# Patient Record
Sex: Female | Born: 2000 | Race: Black or African American | Hispanic: No | Marital: Single | Smoking: Current every day smoker
Health system: Southern US, Community
[De-identification: ages and names within clinical notes are randomized; demographics above are authoritative.]

## PROBLEM LIST (undated history)

## (undated) ENCOUNTER — Inpatient Hospital Stay (HOSPITAL_COMMUNITY): Payer: Self-pay

## (undated) ENCOUNTER — Emergency Department (HOSPITAL_COMMUNITY): Payer: Medicaid Other | Source: Home / Self Care

## (undated) DIAGNOSIS — J45909 Unspecified asthma, uncomplicated: Secondary | ICD-10-CM

---
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
## 2019-12-11 ENCOUNTER — Emergency Department (HOSPITAL_COMMUNITY): Payer: Medicaid Other

## 2019-12-11 ENCOUNTER — Emergency Department (HOSPITAL_COMMUNITY)
Admission: EM | Admit: 2019-12-11 | Discharge: 2019-12-12 | Disposition: A | Discharge status: Home / Self Care | Payer: Medicaid Other | Attending: Emergency Medicine | Admitting: Emergency Medicine

## 2019-12-11 ENCOUNTER — Encounter (HOSPITAL_COMMUNITY): Payer: Self-pay

## 2019-12-11 ENCOUNTER — Other Ambulatory Visit: Payer: Self-pay

## 2019-12-11 DIAGNOSIS — Z23 Encounter for immunization: Secondary | ICD-10-CM | POA: Diagnosis not present

## 2019-12-11 DIAGNOSIS — Y92810 Car as the place of occurrence of the external cause: Secondary | ICD-10-CM | POA: Diagnosis not present

## 2019-12-11 DIAGNOSIS — S79911A Unspecified injury of right hip, initial encounter: Secondary | ICD-10-CM | POA: Diagnosis present

## 2019-12-11 DIAGNOSIS — Y999 Unspecified external cause status: Secondary | ICD-10-CM | POA: Insufficient documentation

## 2019-12-11 DIAGNOSIS — S71001A Unspecified open wound, right hip, initial encounter: Secondary | ICD-10-CM | POA: Diagnosis not present

## 2019-12-11 DIAGNOSIS — M25551 Pain in right hip: Secondary | ICD-10-CM | POA: Insufficient documentation

## 2019-12-11 DIAGNOSIS — Y9389 Activity, other specified: Secondary | ICD-10-CM | POA: Insufficient documentation

## 2019-12-11 DIAGNOSIS — W3400XA Accidental discharge from unspecified firearms or gun, initial encounter: Secondary | ICD-10-CM | POA: Diagnosis not present

## 2019-12-11 LAB — CBC
HCT: 38.2 % (ref 36.0–46.0)
Hemoglobin: 13.4 g/dL (ref 12.0–15.0)
MCH: 31.2 pg (ref 26.0–34.0)
MCHC: 35.1 g/dL (ref 30.0–36.0)
MCV: 88.8 fL (ref 80.0–100.0)
Platelets: 220 10*3/uL (ref 150–400)
RBC: 4.3 MIL/uL (ref 3.87–5.11)
RDW: 13.2 % (ref 11.5–15.5)
WBC: 6.1 10*3/uL (ref 4.0–10.5)
nRBC: 0 % (ref 0.0–0.2)

## 2019-12-11 LAB — COMPREHENSIVE METABOLIC PANEL
ALT: 13 U/L (ref 0–44)
AST: 18 U/L (ref 15–41)
Albumin: 4.4 g/dL (ref 3.5–5.0)
Alkaline Phosphatase: 45 U/L (ref 38–126)
Anion gap: 9 (ref 5–15)
BUN: 9 mg/dL (ref 6–20)
CO2: 22 mmol/L (ref 22–32)
Calcium: 9.2 mg/dL (ref 8.9–10.3)
Chloride: 108 mmol/L (ref 98–111)
Creatinine, Ser: 0.65 mg/dL (ref 0.44–1.00)
GFR calc Af Amer: >60 mL/min (ref >60)
GFR calc non Af Amer: >60 mL/min (ref >60)
Glucose, Bld: 104 mg/dL — ABNORMAL HIGH (ref 70–99)
Potassium: 3.3 mmol/L — ABNORMAL LOW (ref 3.5–5.1)
Sodium: 139 mmol/L (ref 135–145)
Total Bilirubin: 0.5 mg/dL (ref 0.3–1.2)
Total Protein: 7.9 g/dL (ref 6.5–8.1)

## 2019-12-11 LAB — I-STAT CHEM 8, ED
BUN: 7 mg/dL (ref 6–20)
Calcium, Ion: 1.07 mmol/L — ABNORMAL LOW (ref 1.15–1.40)
Chloride: 108 mmol/L (ref 98–111)
Creatinine, Ser: 0.6 mg/dL (ref 0.44–1.00)
Glucose, Bld: 97 mg/dL (ref 70–99)
HCT: 40 % (ref 36.0–46.0)
Hemoglobin: 13.6 g/dL (ref 12.0–15.0)
Potassium: 3.3 mmol/L — ABNORMAL LOW (ref 3.5–5.1)
Sodium: 140 mmol/L (ref 135–145)
TCO2: 22 mmol/L (ref 22–32)

## 2019-12-11 LAB — I-STAT BETA HCG BLOOD, ED (MC, WL, AP ONLY): I-stat hCG, quantitative: <5 m[IU]/mL (ref <5)

## 2019-12-11 LAB — SAMPLE TO BLOOD BANK

## 2019-12-11 LAB — LACTIC ACID, PLASMA: Lactic Acid, Venous: 2.3 mmol/L (ref 0.5–1.9)

## 2019-12-11 LAB — PROTIME-INR
INR: 1 (ref 0.8–1.2)
Prothrombin Time: 12.7 seconds (ref 11.4–15.2)

## 2019-12-11 LAB — ETHANOL: Alcohol, Ethyl (B): <10 mg/dL (ref <10)

## 2019-12-11 MED ORDER — HYDROMORPHONE HCL 1 MG/ML IJ SOLN: 1.0000 mg | Freq: Once | INTRAMUSCULAR | Status: AC

## 2019-12-11 MED ORDER — HYDROMORPHONE HCL 1 MG/ML IJ SOLN
1.0000 mg | Freq: Once | INTRAMUSCULAR | Status: AC
  Administered 2019-12-12: 1 mg via INTRAVENOUS
  Filled 2019-12-11: qty 1

## 2019-12-11 MED ORDER — TETANUS-DIPHTH-ACELL PERTUSSIS 5-2.5-18.5 LF-MCG/0.5 IM SUSP
0.5000 mL | Freq: Once | INTRAMUSCULAR | Status: AC
  Administered 2019-12-11: 0.5 mL via INTRAMUSCULAR

## 2019-12-11 MED ORDER — HYDROMORPHONE HCL 1 MG/ML IJ SOLN
1.0000 mg | Freq: Once | INTRAMUSCULAR | Status: AC
  Administered 2019-12-11: 1 mg via INTRAVENOUS

## 2019-12-11 MED ORDER — IOHEXOL 300 MG/ML  SOLN
80.0000 mL | Freq: Once | INTRAMUSCULAR | Status: AC | PRN
  Administered 2019-12-11: 80 mL via INTRAVENOUS

## 2019-12-11 MED ORDER — HYDROMORPHONE HCL 1 MG/ML IJ SOLN
INTRAMUSCULAR | Status: AC
  Administered 2019-12-11: 1 mg via INTRAVENOUS
  Filled 2019-12-11: qty 1

## 2019-12-11 MED ORDER — HYDROMORPHONE HCL 1 MG/ML IJ SOLN
INTRAMUSCULAR | Status: AC
  Filled 2019-12-11: qty 1

## 2019-12-11 MED ORDER — HYDROCODONE-ACETAMINOPHEN 5-325 MG PO TABS: 1.0000 | ORAL_TABLET | ORAL | 0 refills | Status: AC | PRN

## 2019-12-11 NOTE — Consult Note (Signed)
Responded to page, pt unavailable, no family present, staff will page again if further need for chaplain services.  Rev Donnel Saxon Chaplain

## 2019-12-11 NOTE — Progress Notes (Signed)
Orthopedic Tech Progress Note Patient Details:  Lynn Keller 02-19-2001 [id number]  Ortho Devices Type of Ortho Device: Crutches Ortho Device/Splint Location: RLE Ortho Device/Splint Interventions: Ordered, Application, Adjustment   Post Interventions Patient Tolerated: Poor Instructions Provided: Adjustment of device, Care of device, Poper ambulation with device   [name] [name] 12/11/2019, 11:57 PM
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
## 2019-12-11 NOTE — Discharge Instructions (Signed)
Local wound care with bacitracin and dressing changes twice daily.  Hydrocodone as prescribed as needed for pain.  Follow-up with your primary doctor for a wound check in the next 3 to 4 days, and return to the ER for increasing pain, pus draining from the wound, increased redness, or other new and concerning symptoms.

## 2019-12-11 NOTE — Progress Notes (Signed)
Orthopedic Tech Progress Note Patient Details:  Lynn Keller 2001/07/16 [id number] TRAUMA LEVEL 1  Patient ID: Lynn Keller, female   DOB: 2001/05/04, 19 y.o.   MRN: [id number]   [name] 12/11/2019, 10:14 PM
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
## 2019-12-12 ENCOUNTER — Encounter (HOSPITAL_COMMUNITY): Payer: Self-pay

## 2019-12-12 LAB — CDS SEROLOGY

## 2019-12-12 NOTE — ED Provider Notes (Signed)
Patient required a third dose of IV pain medications due to severe pain with ambulation.  Patient is now feeling improved and is able to ambulate.  She had already been arranged for discharge by Dr. Donley Redder, MD 12/12/19 [phone]
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
